# Patient Record
Sex: Male | Born: 1970 | Race: White | Hispanic: No | Marital: Single | Smoking: Former smoker
Health system: Southern US, Community
[De-identification: ages and names within clinical notes are randomized; demographics above are authoritative.]

## PROBLEM LIST (undated history)

## (undated) DIAGNOSIS — I10 Essential (primary) hypertension: Secondary | ICD-10-CM

---

## 2019-06-12 ENCOUNTER — Encounter (HOSPITAL_COMMUNITY): Payer: Self-pay

## 2019-06-12 ENCOUNTER — Emergency Department (HOSPITAL_COMMUNITY): Payer: No Typology Code available for payment source

## 2019-06-12 ENCOUNTER — Emergency Department (HOSPITAL_COMMUNITY)
Admission: EM | Admit: 2019-06-12 | Discharge: 2019-06-12 | Payer: No Typology Code available for payment source | Attending: Emergency Medicine | Admitting: Emergency Medicine

## 2019-06-12 ENCOUNTER — Other Ambulatory Visit: Payer: Self-pay

## 2019-06-12 DIAGNOSIS — M542 Cervicalgia: Secondary | ICD-10-CM | POA: Diagnosis not present

## 2019-06-12 DIAGNOSIS — Y929 Unspecified place or not applicable: Secondary | ICD-10-CM | POA: Insufficient documentation

## 2019-06-12 DIAGNOSIS — Y999 Unspecified external cause status: Secondary | ICD-10-CM | POA: Diagnosis not present

## 2019-06-12 DIAGNOSIS — Z87891 Personal history of nicotine dependence: Secondary | ICD-10-CM | POA: Diagnosis not present

## 2019-06-12 DIAGNOSIS — I10 Essential (primary) hypertension: Secondary | ICD-10-CM | POA: Insufficient documentation

## 2019-06-12 DIAGNOSIS — Y939 Activity, unspecified: Secondary | ICD-10-CM | POA: Diagnosis not present

## 2019-06-12 HISTORY — DX: Essential (primary) hypertension: I10

## 2019-06-12 MED ORDER — HYDROCODONE-ACETAMINOPHEN 5-325 MG PO TABS
1.0000 | ORAL_TABLET | Freq: Once | ORAL | Status: AC
  Administered 2019-06-12: 1 via ORAL
  Filled 2019-06-12: qty 1

## 2019-06-12 MED ORDER — MELOXICAM 15 MG PO TABS: 15.0000 mg | ORAL_TABLET | Freq: Every day | ORAL | 0 refills | Status: AC

## 2019-06-12 MED ORDER — METHOCARBAMOL 500 MG PO TABS: 500.0000 mg | ORAL_TABLET | Freq: Two times a day (BID) | ORAL | 0 refills | Status: AC
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
## 2019-06-12 NOTE — ED Notes (Signed)
C-Collar removed by PA.
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
## 2019-06-12 NOTE — ED Notes (Signed)
Pt transported to CT ?

## 2019-06-12 NOTE — Discharge Instructions (Addendum)
As discussed, your CT scan of your neck did not show any broken bones. I am sending you home with a pain medication called Meloxicam. You can take it once a day as needed for pain. I am also sending you home with a muscle relaxer called Robaxin. Medication can cause drowsiness. Follow-up with your PCP within the next week if symptoms do not improve. Return to the ER for new or worsening symptoms.

## 2020-10-29 IMAGING — CT CT CERVICAL SPINE W/O CM
3 of 4 series · 13 of 33 positions shown, 16 images · non-contrast
Comparison: None

CLINICAL DATA: Neck trauma midline tenderness

EXAM:
CT CERVICAL SPINE WITHOUT CONTRAST
TECHNIQUE: Multidetector CT imaging of the cervical spine was performed without
intravenous contrast. Multiplanar CT image reconstructions were also
generated.

[Series 7: orthogonal bone · axial · 0.23mm/px · z∈[+1182,+1313]mm · 5 of 106 slices shown, 7 images]
[im 18/106  soft-tissue]
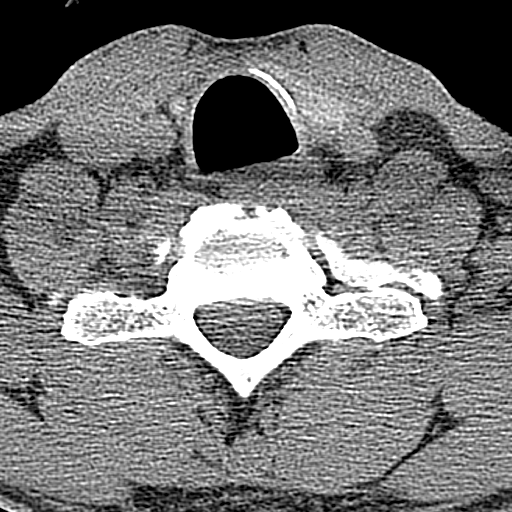
[im 18/106  bone]
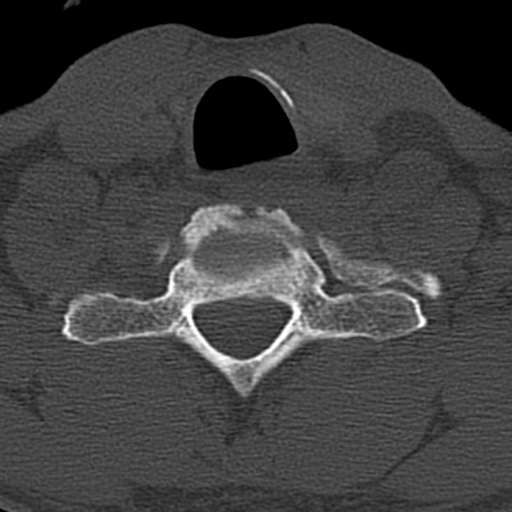
[im 36/106  bone]
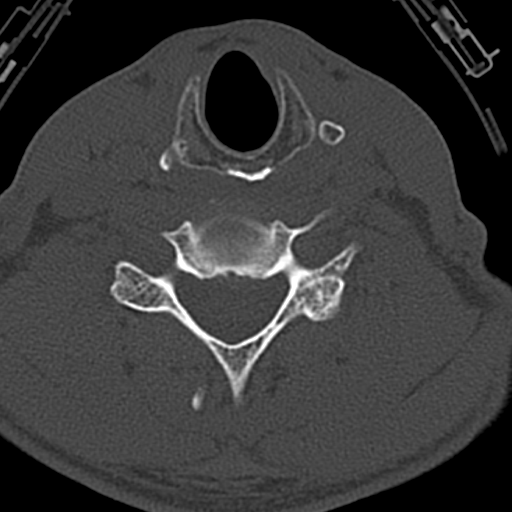
[im 53/106  bone]
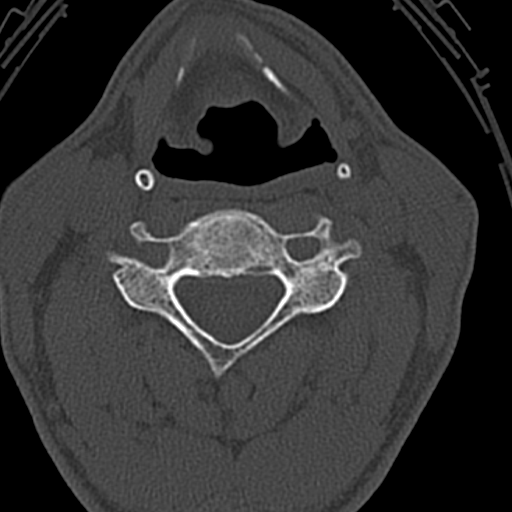
[im 71/106  bone]
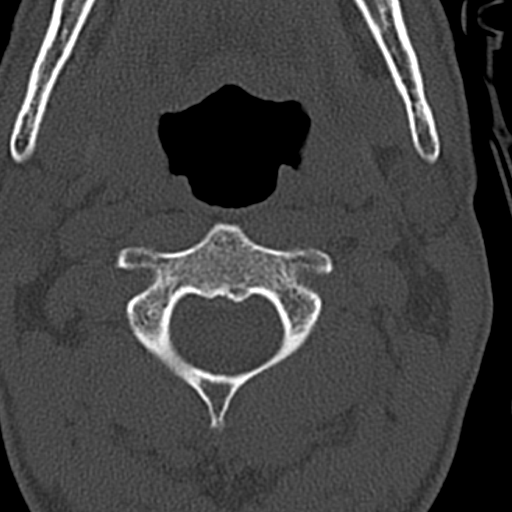
[im 88/106  soft-tissue]
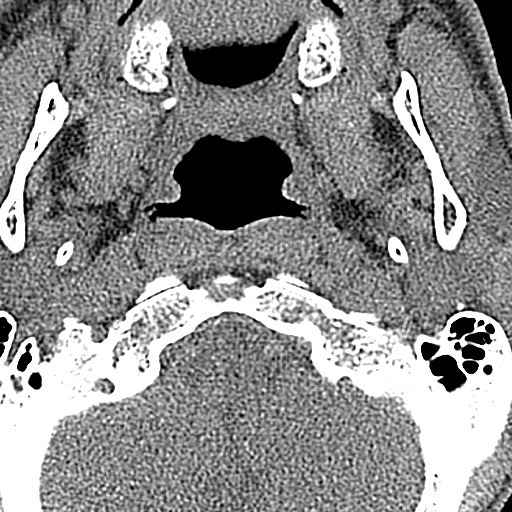
[im 88/106  bone]
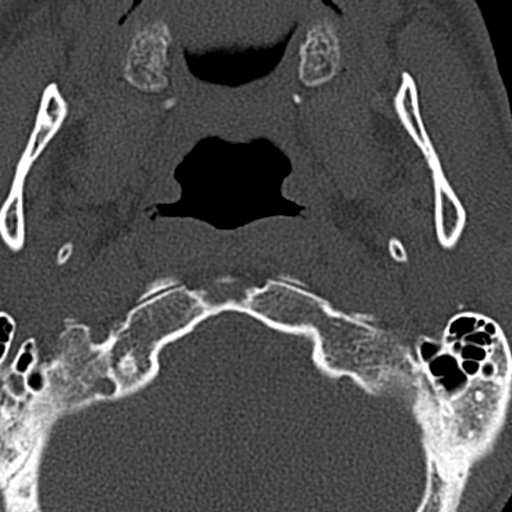

[Series 8: coronal bone · coronal · 0.30mm/px · 3 of 61 slices shown]
[im 13/61  bone]
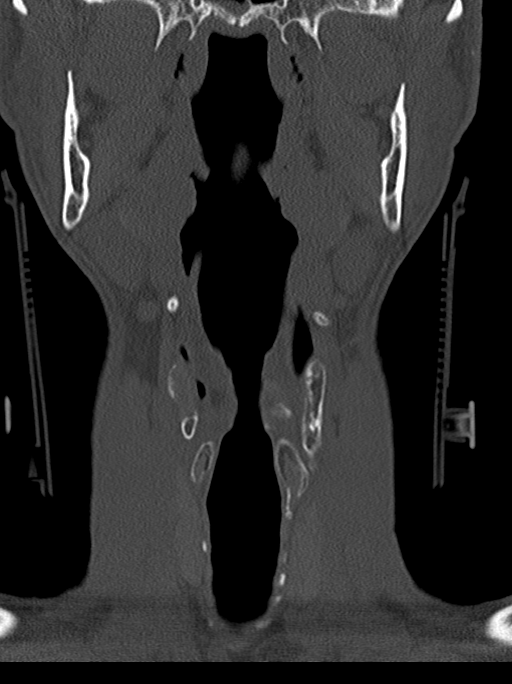
[im 25/61  bone]
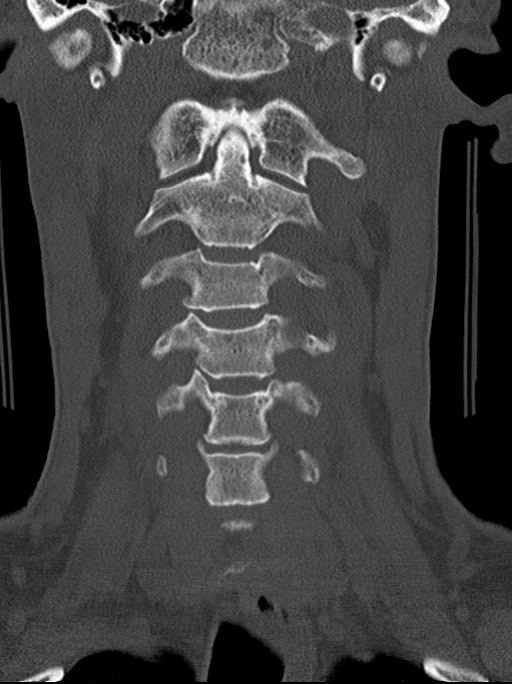
[im 37/61  bone]
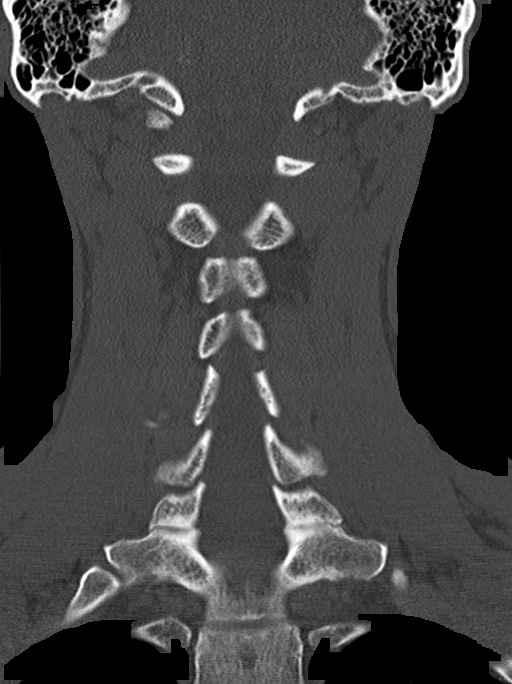

[Series 9: sagittal bone · sagittal · 0.30mm/px · 5 of 61 slices shown, 6 images]
[im 21/61  bone]
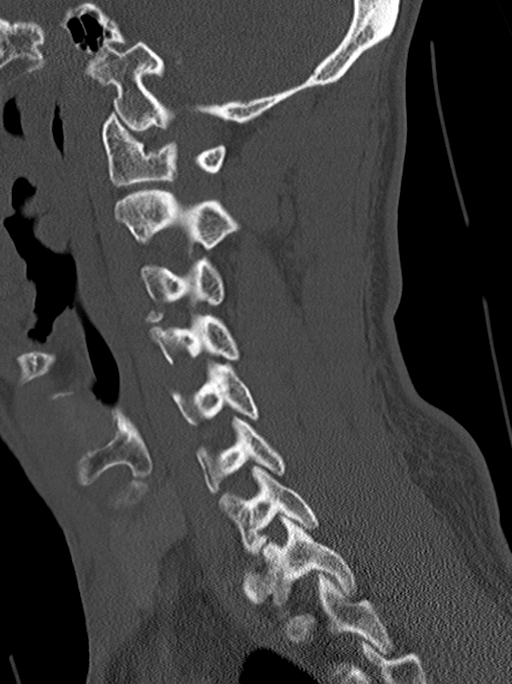
[im 26/61  bone]
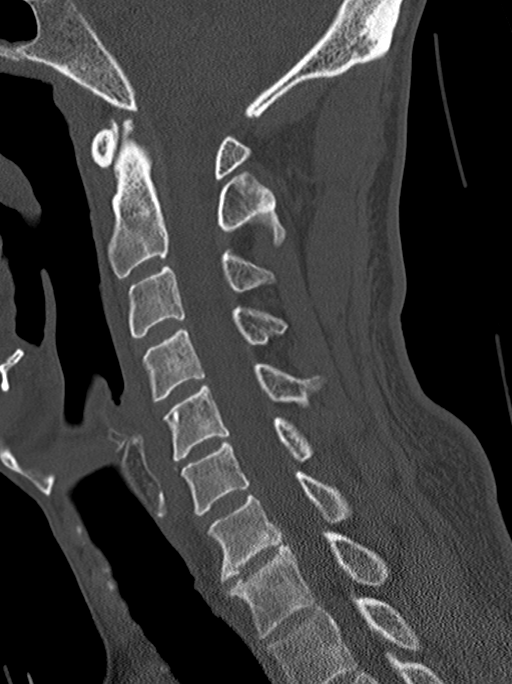
[im 31/61  soft-tissue]
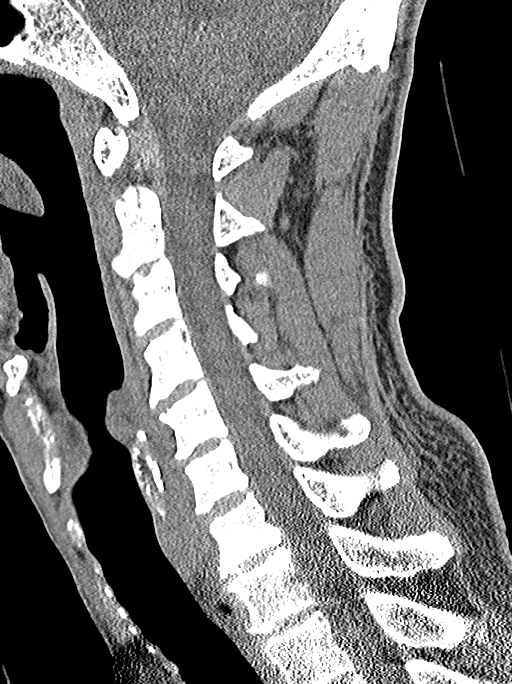
[im 31/61  bone]
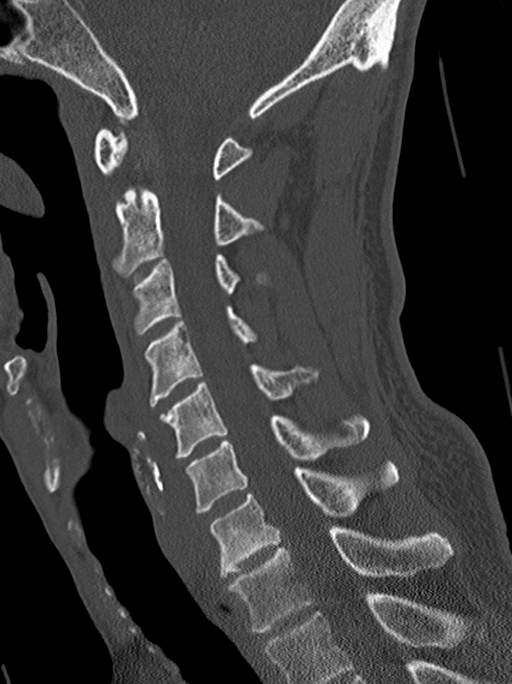
[im 36/61  bone]
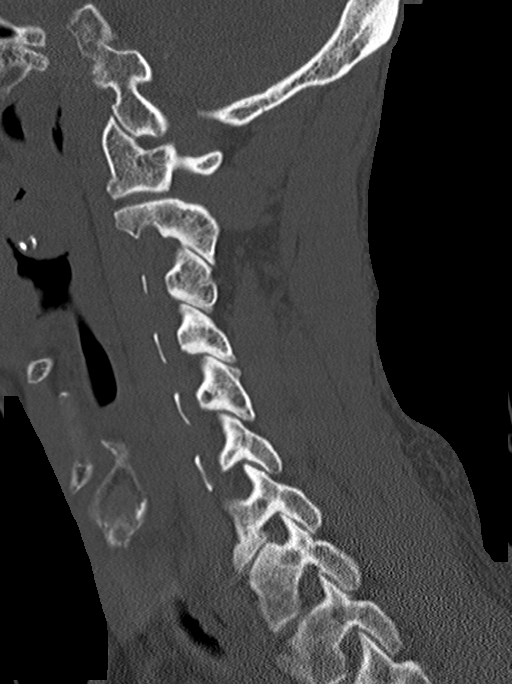
[im 41/61  bone]
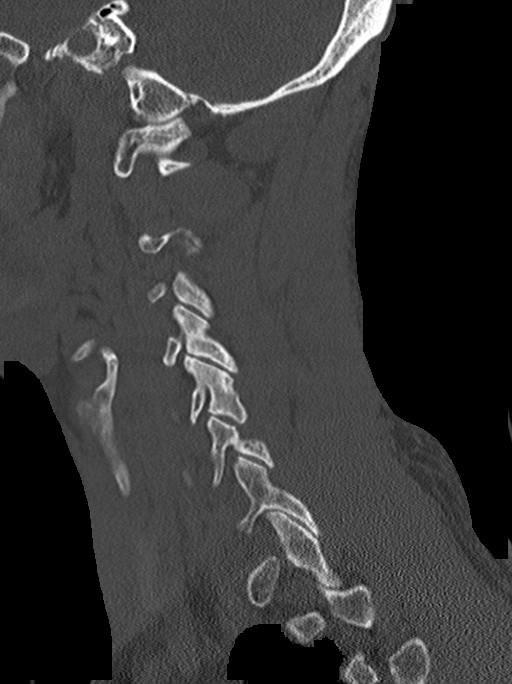

[13 of 33 positions shown; findings below may reference images not displayed]

FINDINGS: Alignment: Normal.

Skull base and vertebrae: No acute fracture. No primary bone lesion
or focal pathologic process.

Soft tissues and spinal canal: No prevertebral fluid or swelling. No
visible canal hematoma.

Disc levels: Mild degenerative changes in the upper cervical spine
with moderate degenerative changes at C7-T1.

Upper chest: Negative.

Other: None
IMPRESSION: 1. No acute fracture or dislocation in the cervical spine.
2. Mild degenerative changes in the upper cervical spine with
moderate degenerative changes at C7-T1.
